# Patient Record
Sex: Male | Born: 1945
Health system: Southern US, Community
[De-identification: ages and names within clinical notes are randomized; demographics above are authoritative.]

---

## 2000-05-14 ENCOUNTER — Ambulatory Visit (HOSPITAL_BASED_OUTPATIENT_CLINIC_OR_DEPARTMENT_OTHER): Admission: RE | Admit: 2000-05-14 | Discharge: 2000-05-14 | Payer: Self-pay | Admitting: Urology

## 2002-10-05 ENCOUNTER — Encounter: Admission: RE | Admit: 2002-10-05 | Discharge: 2002-10-05 | Payer: Self-pay | Admitting: Internal Medicine

## 2013-10-23 DIAGNOSIS — S139XXA Sprain of joints and ligaments of unspecified parts of neck, initial encounter: Secondary | ICD-10-CM | POA: Diagnosis not present

## 2013-10-23 DIAGNOSIS — R350 Frequency of micturition: Secondary | ICD-10-CM | POA: Diagnosis not present

## 2013-10-26 DIAGNOSIS — M62838 Other muscle spasm: Secondary | ICD-10-CM | POA: Diagnosis not present

## 2013-10-26 DIAGNOSIS — M9981 Other biomechanical lesions of cervical region: Secondary | ICD-10-CM | POA: Diagnosis not present

## 2013-10-26 DIAGNOSIS — M5412 Radiculopathy, cervical region: Secondary | ICD-10-CM | POA: Diagnosis not present

## 2013-10-26 DIAGNOSIS — M5030 Other cervical disc degeneration, unspecified cervical region: Secondary | ICD-10-CM | POA: Diagnosis not present

## 2013-10-26 DIAGNOSIS — R51 Headache: Secondary | ICD-10-CM | POA: Diagnosis not present

## 2013-10-26 DIAGNOSIS — M999 Biomechanical lesion, unspecified: Secondary | ICD-10-CM | POA: Diagnosis not present

## 2013-10-27 DIAGNOSIS — M5412 Radiculopathy, cervical region: Secondary | ICD-10-CM | POA: Diagnosis not present

## 2013-10-27 DIAGNOSIS — M62838 Other muscle spasm: Secondary | ICD-10-CM | POA: Diagnosis not present

## 2013-10-27 DIAGNOSIS — R51 Headache: Secondary | ICD-10-CM | POA: Diagnosis not present

## 2013-10-27 DIAGNOSIS — M999 Biomechanical lesion, unspecified: Secondary | ICD-10-CM | POA: Diagnosis not present

## 2013-10-27 DIAGNOSIS — M9981 Other biomechanical lesions of cervical region: Secondary | ICD-10-CM | POA: Diagnosis not present

## 2013-10-27 DIAGNOSIS — M5030 Other cervical disc degeneration, unspecified cervical region: Secondary | ICD-10-CM | POA: Diagnosis not present

## 2013-10-31 DIAGNOSIS — M5412 Radiculopathy, cervical region: Secondary | ICD-10-CM | POA: Diagnosis not present

## 2013-10-31 DIAGNOSIS — M999 Biomechanical lesion, unspecified: Secondary | ICD-10-CM | POA: Diagnosis not present

## 2013-10-31 DIAGNOSIS — M62838 Other muscle spasm: Secondary | ICD-10-CM | POA: Diagnosis not present

## 2013-10-31 DIAGNOSIS — M9981 Other biomechanical lesions of cervical region: Secondary | ICD-10-CM | POA: Diagnosis not present

## 2013-10-31 DIAGNOSIS — R51 Headache: Secondary | ICD-10-CM | POA: Diagnosis not present

## 2013-10-31 DIAGNOSIS — M5030 Other cervical disc degeneration, unspecified cervical region: Secondary | ICD-10-CM | POA: Diagnosis not present

## 2013-11-01 DIAGNOSIS — R51 Headache: Secondary | ICD-10-CM | POA: Diagnosis not present

## 2013-11-01 DIAGNOSIS — M5030 Other cervical disc degeneration, unspecified cervical region: Secondary | ICD-10-CM | POA: Diagnosis not present

## 2013-11-01 DIAGNOSIS — M62838 Other muscle spasm: Secondary | ICD-10-CM | POA: Diagnosis not present

## 2013-11-01 DIAGNOSIS — M5412 Radiculopathy, cervical region: Secondary | ICD-10-CM | POA: Diagnosis not present

## 2013-11-01 DIAGNOSIS — M999 Biomechanical lesion, unspecified: Secondary | ICD-10-CM | POA: Diagnosis not present

## 2013-11-01 DIAGNOSIS — M9981 Other biomechanical lesions of cervical region: Secondary | ICD-10-CM | POA: Diagnosis not present

## 2013-11-03 DIAGNOSIS — M5412 Radiculopathy, cervical region: Secondary | ICD-10-CM | POA: Diagnosis not present

## 2013-11-03 DIAGNOSIS — R51 Headache: Secondary | ICD-10-CM | POA: Diagnosis not present

## 2013-11-03 DIAGNOSIS — M9981 Other biomechanical lesions of cervical region: Secondary | ICD-10-CM | POA: Diagnosis not present

## 2013-11-03 DIAGNOSIS — M5030 Other cervical disc degeneration, unspecified cervical region: Secondary | ICD-10-CM | POA: Diagnosis not present

## 2013-11-03 DIAGNOSIS — M999 Biomechanical lesion, unspecified: Secondary | ICD-10-CM | POA: Diagnosis not present

## 2013-11-03 DIAGNOSIS — M62838 Other muscle spasm: Secondary | ICD-10-CM | POA: Diagnosis not present

## 2013-11-06 DIAGNOSIS — M999 Biomechanical lesion, unspecified: Secondary | ICD-10-CM | POA: Diagnosis not present

## 2013-11-06 DIAGNOSIS — R51 Headache: Secondary | ICD-10-CM | POA: Diagnosis not present

## 2013-11-06 DIAGNOSIS — M9981 Other biomechanical lesions of cervical region: Secondary | ICD-10-CM | POA: Diagnosis not present

## 2013-11-06 DIAGNOSIS — M5030 Other cervical disc degeneration, unspecified cervical region: Secondary | ICD-10-CM | POA: Diagnosis not present

## 2013-11-06 DIAGNOSIS — M62838 Other muscle spasm: Secondary | ICD-10-CM | POA: Diagnosis not present

## 2013-11-06 DIAGNOSIS — M5412 Radiculopathy, cervical region: Secondary | ICD-10-CM | POA: Diagnosis not present

## 2013-11-13 DIAGNOSIS — R351 Nocturia: Secondary | ICD-10-CM | POA: Diagnosis not present

## 2013-11-13 DIAGNOSIS — I10 Essential (primary) hypertension: Secondary | ICD-10-CM | POA: Diagnosis not present

## 2013-11-13 DIAGNOSIS — E7800 Pure hypercholesterolemia, unspecified: Secondary | ICD-10-CM | POA: Diagnosis not present

## 2014-02-08 DIAGNOSIS — H43819 Vitreous degeneration, unspecified eye: Secondary | ICD-10-CM | POA: Diagnosis not present

## 2014-02-14 DIAGNOSIS — L919 Hypertrophic disorder of the skin, unspecified: Secondary | ICD-10-CM | POA: Diagnosis not present

## 2014-02-14 DIAGNOSIS — D235 Other benign neoplasm of skin of trunk: Secondary | ICD-10-CM | POA: Diagnosis not present

## 2014-02-14 DIAGNOSIS — L821 Other seborrheic keratosis: Secondary | ICD-10-CM | POA: Diagnosis not present

## 2014-02-14 DIAGNOSIS — L909 Atrophic disorder of skin, unspecified: Secondary | ICD-10-CM | POA: Diagnosis not present

## 2014-02-14 DIAGNOSIS — Z23 Encounter for immunization: Secondary | ICD-10-CM | POA: Diagnosis not present

## 2014-02-14 DIAGNOSIS — L819 Disorder of pigmentation, unspecified: Secondary | ICD-10-CM | POA: Diagnosis not present

## 2014-03-22 DIAGNOSIS — J3489 Other specified disorders of nose and nasal sinuses: Secondary | ICD-10-CM | POA: Diagnosis not present

## 2014-03-22 DIAGNOSIS — R0982 Postnasal drip: Secondary | ICD-10-CM | POA: Diagnosis not present

## 2014-03-22 DIAGNOSIS — R05 Cough: Secondary | ICD-10-CM | POA: Diagnosis not present

## 2014-03-22 DIAGNOSIS — R059 Cough, unspecified: Secondary | ICD-10-CM | POA: Diagnosis not present

## 2014-06-23 DIAGNOSIS — Z23 Encounter for immunization: Secondary | ICD-10-CM | POA: Diagnosis not present

## 2014-10-17 DIAGNOSIS — Z1389 Encounter for screening for other disorder: Secondary | ICD-10-CM | POA: Diagnosis not present

## 2014-10-17 DIAGNOSIS — R002 Palpitations: Secondary | ICD-10-CM | POA: Diagnosis not present

## 2015-03-14 DIAGNOSIS — R002 Palpitations: Secondary | ICD-10-CM | POA: Diagnosis not present

## 2015-03-14 DIAGNOSIS — R072 Precordial pain: Secondary | ICD-10-CM | POA: Diagnosis not present

## 2015-03-29 DIAGNOSIS — R072 Precordial pain: Secondary | ICD-10-CM | POA: Diagnosis not present

## 2015-03-29 DIAGNOSIS — R002 Palpitations: Secondary | ICD-10-CM | POA: Diagnosis not present

## 2015-04-10 DIAGNOSIS — Z79899 Other long term (current) drug therapy: Secondary | ICD-10-CM | POA: Diagnosis not present

## 2015-04-10 DIAGNOSIS — M47816 Spondylosis without myelopathy or radiculopathy, lumbar region: Secondary | ICD-10-CM | POA: Diagnosis not present

## 2015-04-10 DIAGNOSIS — S32010A Wedge compression fracture of first lumbar vertebra, initial encounter for closed fracture: Secondary | ICD-10-CM | POA: Diagnosis not present

## 2015-04-10 DIAGNOSIS — S32019A Unspecified fracture of first lumbar vertebra, initial encounter for closed fracture: Secondary | ICD-10-CM | POA: Diagnosis not present

## 2015-04-10 DIAGNOSIS — I10 Essential (primary) hypertension: Secondary | ICD-10-CM | POA: Diagnosis not present

## 2015-04-10 DIAGNOSIS — M1990 Unspecified osteoarthritis, unspecified site: Secondary | ICD-10-CM | POA: Diagnosis not present

## 2015-04-10 DIAGNOSIS — K759 Inflammatory liver disease, unspecified: Secondary | ICD-10-CM | POA: Diagnosis not present

## 2015-04-10 DIAGNOSIS — K259 Gastric ulcer, unspecified as acute or chronic, without hemorrhage or perforation: Secondary | ICD-10-CM | POA: Diagnosis not present

## 2015-04-10 DIAGNOSIS — M47896 Other spondylosis, lumbar region: Secondary | ICD-10-CM | POA: Diagnosis not present

## 2015-04-10 DIAGNOSIS — M4316 Spondylolisthesis, lumbar region: Secondary | ICD-10-CM | POA: Diagnosis not present

## 2015-04-12 DIAGNOSIS — S3992XA Unspecified injury of lower back, initial encounter: Secondary | ICD-10-CM | POA: Diagnosis not present

## 2015-04-12 DIAGNOSIS — S32010A Wedge compression fracture of first lumbar vertebra, initial encounter for closed fracture: Secondary | ICD-10-CM | POA: Diagnosis not present

## 2015-04-12 DIAGNOSIS — K5900 Constipation, unspecified: Secondary | ICD-10-CM | POA: Diagnosis not present

## 2015-04-15 ENCOUNTER — Other Ambulatory Visit (HOSPITAL_BASED_OUTPATIENT_CLINIC_OR_DEPARTMENT_OTHER): Payer: Self-pay | Admitting: Family Medicine

## 2015-04-15 DIAGNOSIS — IMO0002 Reserved for concepts with insufficient information to code with codable children: Secondary | ICD-10-CM

## 2015-04-20 ENCOUNTER — Ambulatory Visit (HOSPITAL_BASED_OUTPATIENT_CLINIC_OR_DEPARTMENT_OTHER)
Admission: RE | Admit: 2015-04-20 | Discharge: 2015-04-20 | Disposition: A | Discharge status: Home / Self Care | Payer: Medicare Other | Source: Ambulatory Visit | Attending: Family Medicine | Admitting: Family Medicine

## 2015-04-20 DIAGNOSIS — M1288 Other specific arthropathies, not elsewhere classified, other specified site: Secondary | ICD-10-CM | POA: Insufficient documentation

## 2015-04-20 DIAGNOSIS — M4856XA Collapsed vertebra, not elsewhere classified, lumbar region, initial encounter for fracture: Secondary | ICD-10-CM | POA: Insufficient documentation

## 2015-04-20 DIAGNOSIS — M5136 Other intervertebral disc degeneration, lumbar region: Secondary | ICD-10-CM | POA: Diagnosis not present

## 2015-04-20 DIAGNOSIS — M4806 Spinal stenosis, lumbar region: Secondary | ICD-10-CM | POA: Insufficient documentation

## 2015-04-20 DIAGNOSIS — IMO0002 Reserved for concepts with insufficient information to code with codable children: Secondary | ICD-10-CM

## 2015-04-20 DIAGNOSIS — M545 Low back pain: Secondary | ICD-10-CM | POA: Diagnosis present

## 2015-04-24 DIAGNOSIS — I340 Nonrheumatic mitral (valve) insufficiency: Secondary | ICD-10-CM | POA: Diagnosis not present

## 2015-04-24 DIAGNOSIS — I700 Atherosclerosis of aorta: Secondary | ICD-10-CM | POA: Diagnosis not present

## 2015-04-30 DIAGNOSIS — S32000A Wedge compression fracture of unspecified lumbar vertebra, initial encounter for closed fracture: Secondary | ICD-10-CM | POA: Diagnosis not present

## 2015-05-02 ENCOUNTER — Other Ambulatory Visit (HOSPITAL_BASED_OUTPATIENT_CLINIC_OR_DEPARTMENT_OTHER): Payer: Self-pay | Admitting: Family Medicine

## 2015-05-02 ENCOUNTER — Ambulatory Visit (HOSPITAL_BASED_OUTPATIENT_CLINIC_OR_DEPARTMENT_OTHER)
Admission: RE | Admit: 2015-05-02 | Discharge: 2015-05-02 | Disposition: A | Discharge status: Home / Self Care | Payer: Medicare Other | Source: Ambulatory Visit | Attending: Family Medicine | Admitting: Family Medicine

## 2015-05-02 DIAGNOSIS — N2889 Other specified disorders of kidney and ureter: Secondary | ICD-10-CM

## 2015-05-02 DIAGNOSIS — N281 Cyst of kidney, acquired: Secondary | ICD-10-CM

## 2015-05-02 DIAGNOSIS — N289 Disorder of kidney and ureter, unspecified: Secondary | ICD-10-CM

## 2015-05-02 DIAGNOSIS — M545 Low back pain: Secondary | ICD-10-CM

## 2015-05-28 DIAGNOSIS — S32010D Wedge compression fracture of first lumbar vertebra, subsequent encounter for fracture with routine healing: Secondary | ICD-10-CM | POA: Diagnosis not present

## 2015-06-13 DIAGNOSIS — Z23 Encounter for immunization: Secondary | ICD-10-CM | POA: Diagnosis not present

## 2015-06-18 DIAGNOSIS — S32010D Wedge compression fracture of first lumbar vertebra, subsequent encounter for fracture with routine healing: Secondary | ICD-10-CM | POA: Diagnosis not present

## 2015-07-16 DIAGNOSIS — S32010D Wedge compression fracture of first lumbar vertebra, subsequent encounter for fracture with routine healing: Secondary | ICD-10-CM | POA: Diagnosis not present

## 2015-10-08 DIAGNOSIS — S32010D Wedge compression fracture of first lumbar vertebra, subsequent encounter for fracture with routine healing: Secondary | ICD-10-CM | POA: Diagnosis not present

## 2016-01-03 DIAGNOSIS — M25522 Pain in left elbow: Secondary | ICD-10-CM | POA: Diagnosis not present

## 2016-05-27 DIAGNOSIS — Z23 Encounter for immunization: Secondary | ICD-10-CM | POA: Diagnosis not present

## 2016-07-27 DIAGNOSIS — I10 Essential (primary) hypertension: Secondary | ICD-10-CM | POA: Diagnosis not present

## 2016-07-27 DIAGNOSIS — Z0000 Encounter for general adult medical examination without abnormal findings: Secondary | ICD-10-CM | POA: Diagnosis not present

## 2016-07-27 DIAGNOSIS — Z1211 Encounter for screening for malignant neoplasm of colon: Secondary | ICD-10-CM | POA: Diagnosis not present

## 2016-07-27 DIAGNOSIS — Z125 Encounter for screening for malignant neoplasm of prostate: Secondary | ICD-10-CM | POA: Diagnosis not present

## 2017-02-23 DIAGNOSIS — R109 Unspecified abdominal pain: Secondary | ICD-10-CM | POA: Diagnosis not present

## 2017-02-24 DIAGNOSIS — R109 Unspecified abdominal pain: Secondary | ICD-10-CM | POA: Diagnosis not present

## 2017-06-24 IMAGING — US US RENAL
1 series · 14 of 23 positions shown · non-contrast
Comparison: 04/20/2015

CLINICAL DATA: Followup cyst

EXAM:
RENAL / URINARY TRACT ULTRASOUND COMPLETE

[Series 1: us renal · 0.24mm/px · 14 of 23 slices shown]
[im 1/23]
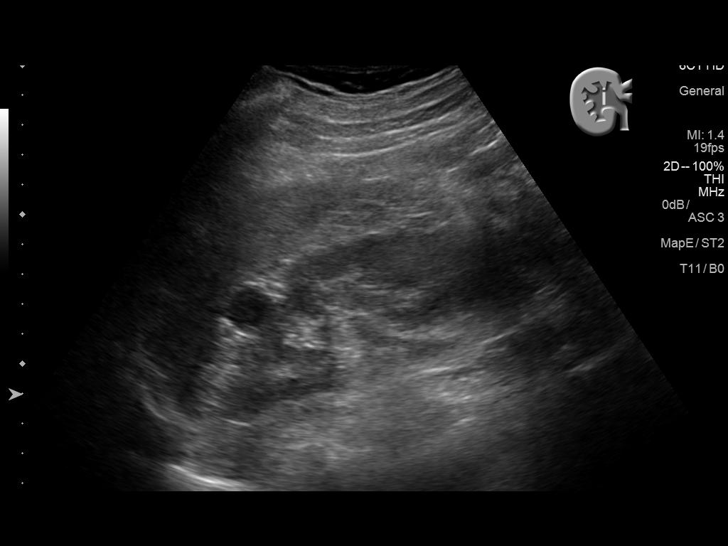
[im 3/23]
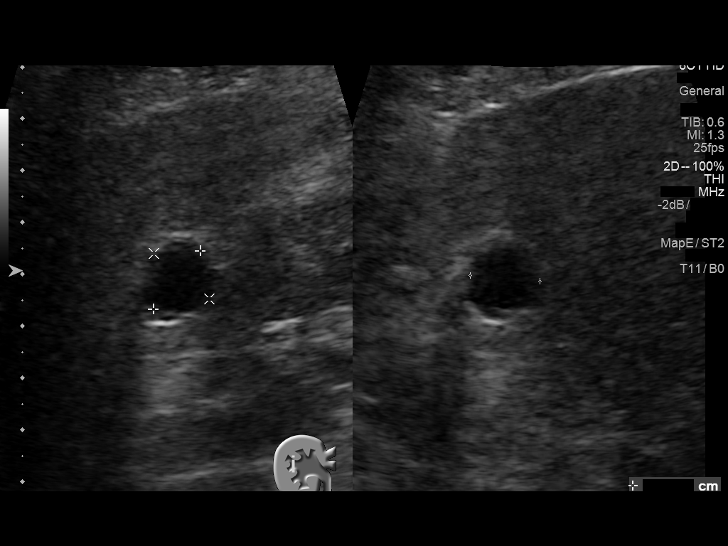
[im 5/23]
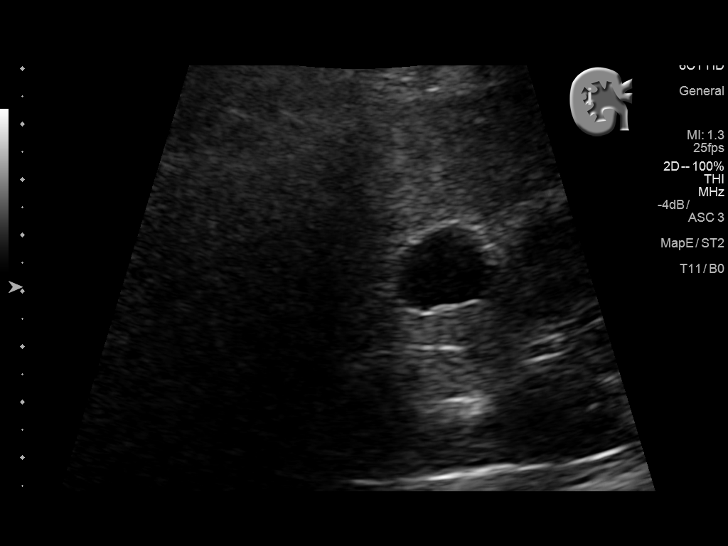
[im 6/23]
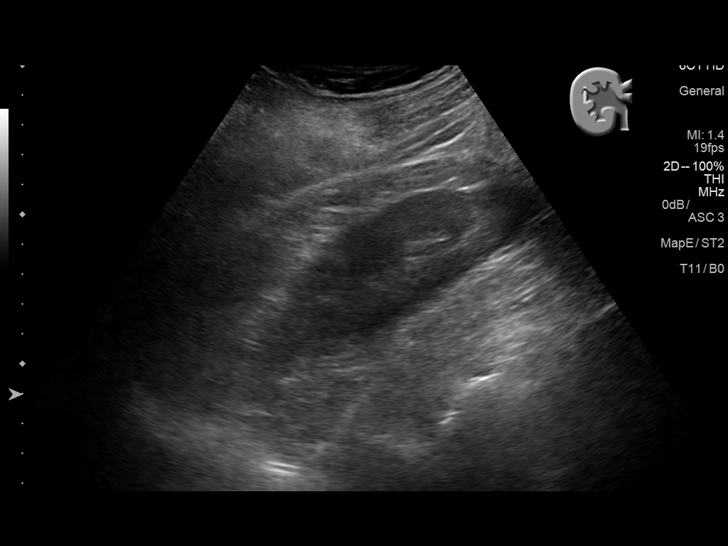
[im 8/23]
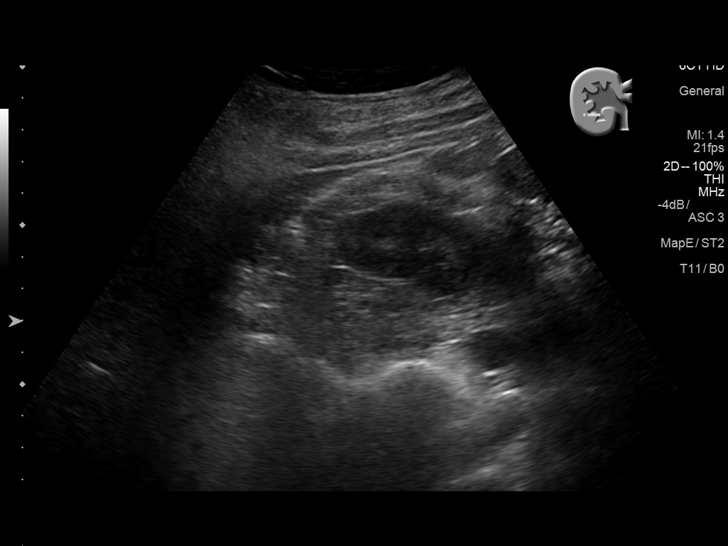
[im 10/23]
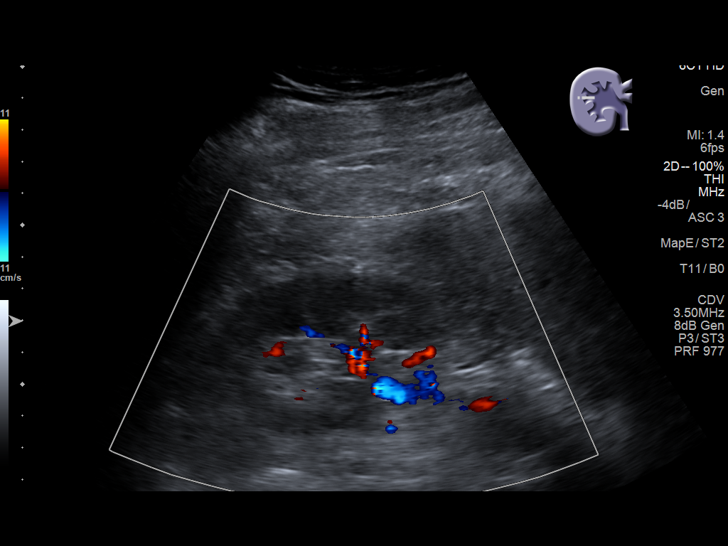
[im 11/23]
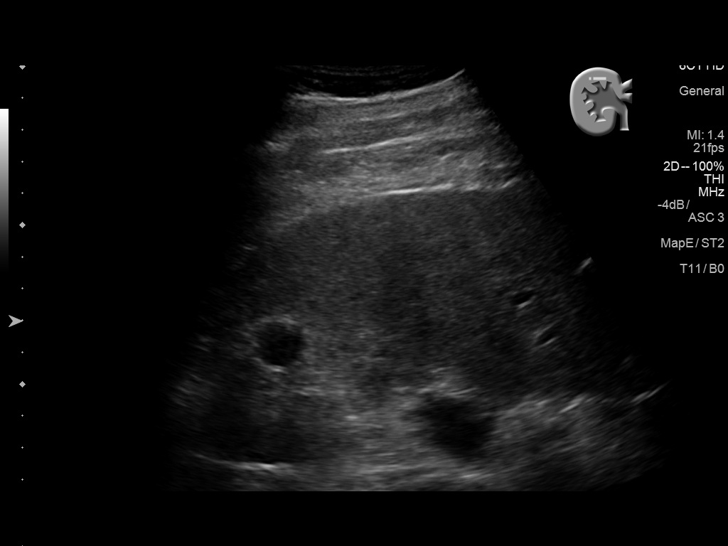
[im 13/23]
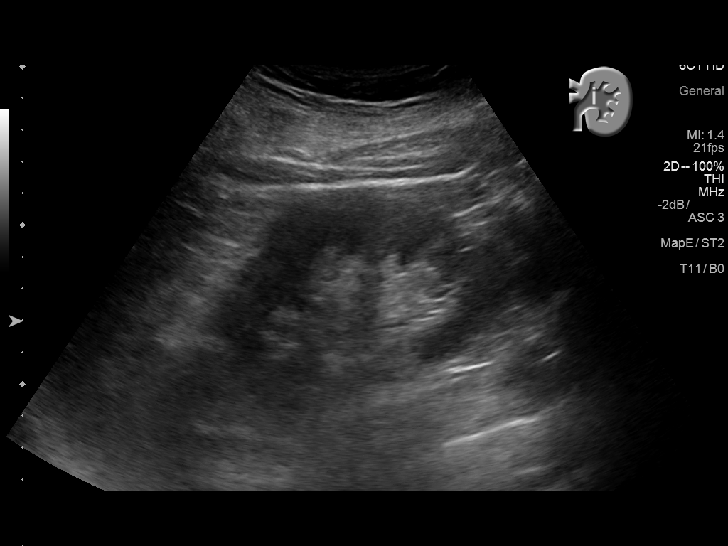
[im 14/23]
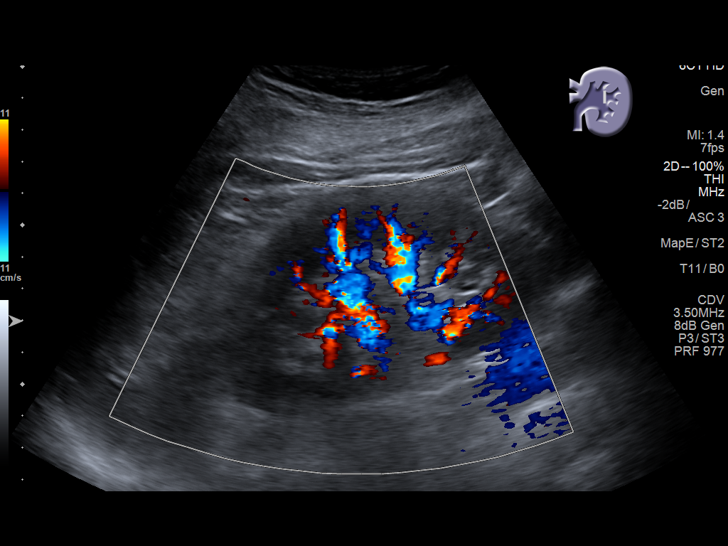
[im 16/23]
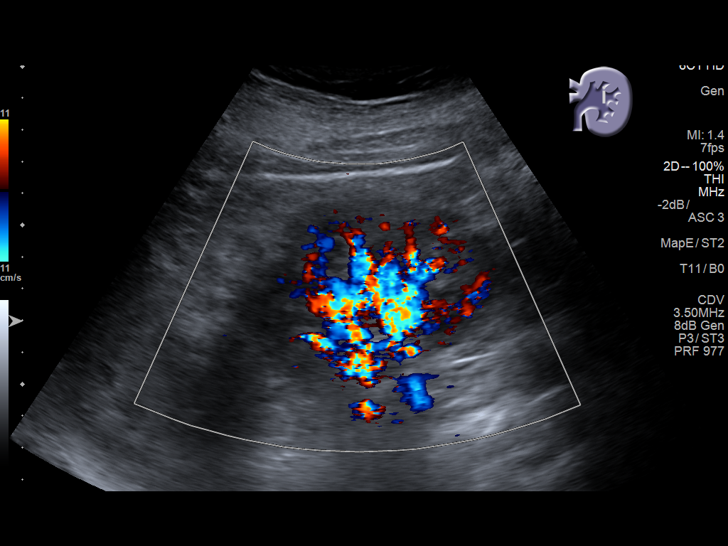
[im 18/23]
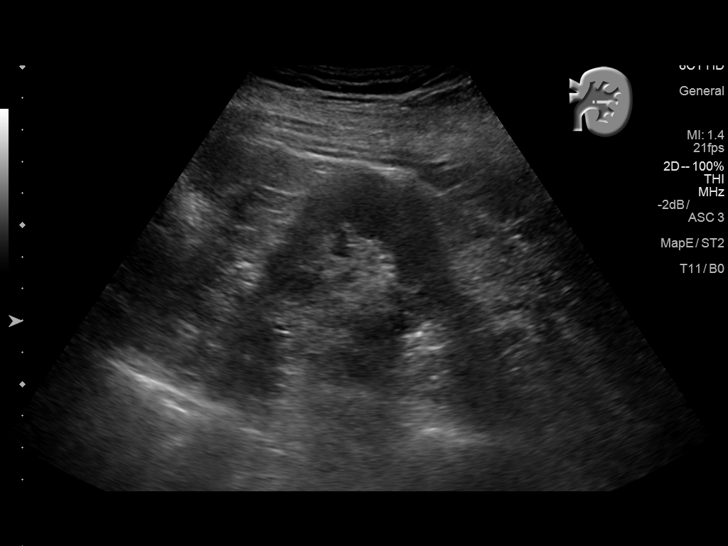
[im 19/23]
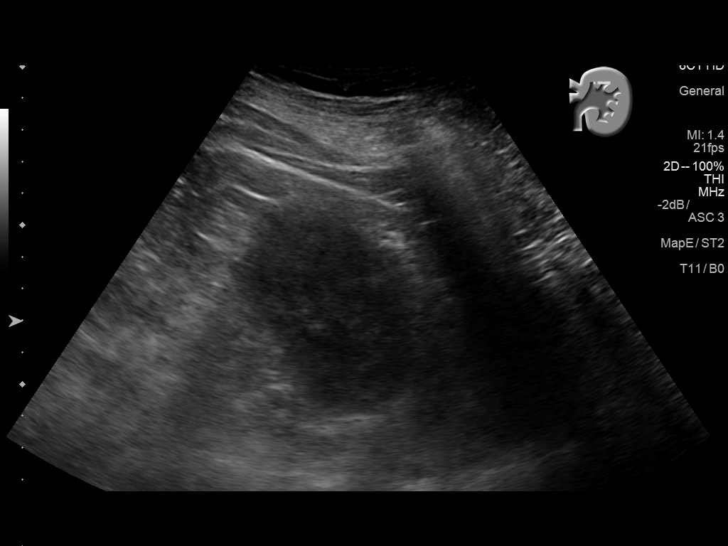
[im 21/23]
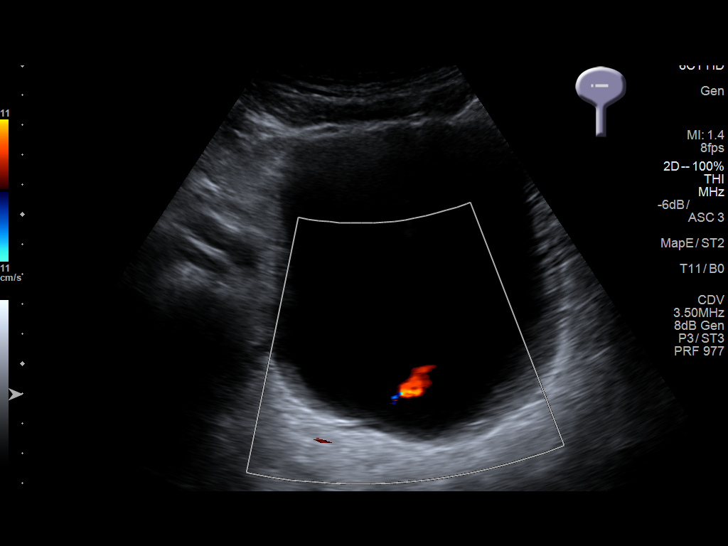
[im 23/23]
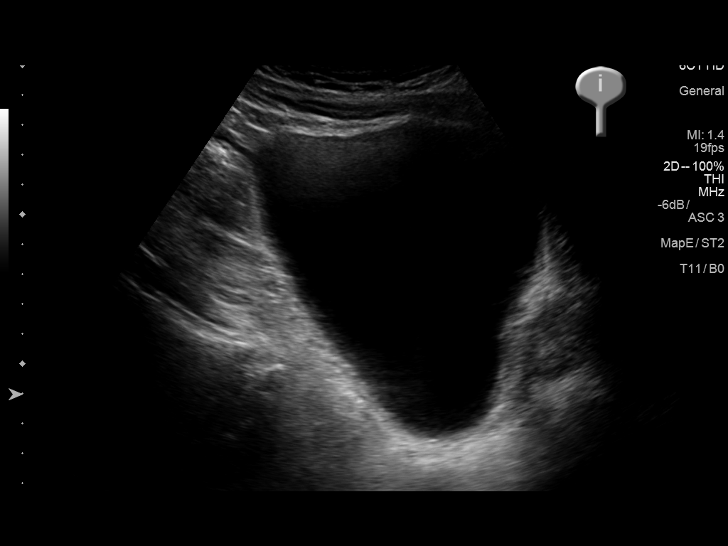

[14 of 23 positions shown; findings below may reference images not displayed]

FINDINGS: Right Kidney:

Length: 12.1 cm. Echogenicity within normal limits. No mass or
hydronephrosis visualized. Simple cyst in the upper pole.

Left Kidney:

Length: 11.7 cm. Echogenicity within normal limits. No mass or
hydronephrosis visualized.

Bladder:

Appears normal for degree of bladder distention.
IMPRESSION: No acute pathology.  Simple cyst in the right kidney.

## 2017-07-01 DIAGNOSIS — Z1211 Encounter for screening for malignant neoplasm of colon: Secondary | ICD-10-CM | POA: Diagnosis not present

## 2017-07-01 DIAGNOSIS — D126 Benign neoplasm of colon, unspecified: Secondary | ICD-10-CM | POA: Diagnosis not present

## 2017-07-02 DIAGNOSIS — Z23 Encounter for immunization: Secondary | ICD-10-CM | POA: Diagnosis not present

## 2017-07-06 DIAGNOSIS — D126 Benign neoplasm of colon, unspecified: Secondary | ICD-10-CM | POA: Diagnosis not present

## 2017-07-06 DIAGNOSIS — Z1211 Encounter for screening for malignant neoplasm of colon: Secondary | ICD-10-CM | POA: Diagnosis not present

## 2017-07-30 DIAGNOSIS — Z0000 Encounter for general adult medical examination without abnormal findings: Secondary | ICD-10-CM | POA: Diagnosis not present

## 2017-07-30 DIAGNOSIS — Z23 Encounter for immunization: Secondary | ICD-10-CM | POA: Diagnosis not present

## 2017-07-30 DIAGNOSIS — N400 Enlarged prostate without lower urinary tract symptoms: Secondary | ICD-10-CM | POA: Diagnosis not present

## 2017-07-30 DIAGNOSIS — I10 Essential (primary) hypertension: Secondary | ICD-10-CM | POA: Diagnosis not present

## 2017-09-13 DIAGNOSIS — G4700 Insomnia, unspecified: Secondary | ICD-10-CM | POA: Diagnosis not present

## 2017-10-14 DIAGNOSIS — G4700 Insomnia, unspecified: Secondary | ICD-10-CM | POA: Diagnosis not present

## 2017-10-19 DIAGNOSIS — N529 Male erectile dysfunction, unspecified: Secondary | ICD-10-CM | POA: Diagnosis not present

## 2017-10-19 DIAGNOSIS — Z803 Family history of malignant neoplasm of breast: Secondary | ICD-10-CM | POA: Diagnosis not present

## 2017-10-19 DIAGNOSIS — G4700 Insomnia, unspecified: Secondary | ICD-10-CM | POA: Diagnosis not present

## 2017-10-19 DIAGNOSIS — K08409 Partial loss of teeth, unspecified cause, unspecified class: Secondary | ICD-10-CM | POA: Diagnosis not present

## 2017-10-19 DIAGNOSIS — Z888 Allergy status to other drugs, medicaments and biological substances status: Secondary | ICD-10-CM | POA: Diagnosis not present

## 2017-10-19 DIAGNOSIS — I10 Essential (primary) hypertension: Secondary | ICD-10-CM | POA: Diagnosis not present

## 2017-10-22 DIAGNOSIS — J069 Acute upper respiratory infection, unspecified: Secondary | ICD-10-CM | POA: Diagnosis not present

## 2018-01-11 DIAGNOSIS — G4700 Insomnia, unspecified: Secondary | ICD-10-CM | POA: Diagnosis not present

## 2018-04-13 DIAGNOSIS — M25561 Pain in right knee: Secondary | ICD-10-CM | POA: Diagnosis not present

## 2018-04-13 DIAGNOSIS — G4700 Insomnia, unspecified: Secondary | ICD-10-CM | POA: Diagnosis not present

## 2018-04-13 DIAGNOSIS — G8929 Other chronic pain: Secondary | ICD-10-CM | POA: Diagnosis not present

## 2018-04-13 DIAGNOSIS — M25562 Pain in left knee: Secondary | ICD-10-CM | POA: Diagnosis not present

## 2018-04-29 DIAGNOSIS — M170 Bilateral primary osteoarthritis of knee: Secondary | ICD-10-CM | POA: Diagnosis not present

## 2018-05-20 DIAGNOSIS — R69 Illness, unspecified: Secondary | ICD-10-CM | POA: Diagnosis not present

## 2018-05-26 DIAGNOSIS — D225 Melanocytic nevi of trunk: Secondary | ICD-10-CM | POA: Diagnosis not present

## 2018-05-26 DIAGNOSIS — L821 Other seborrheic keratosis: Secondary | ICD-10-CM | POA: Diagnosis not present

## 2018-05-26 DIAGNOSIS — B360 Pityriasis versicolor: Secondary | ICD-10-CM | POA: Diagnosis not present

## 2018-06-13 DIAGNOSIS — R69 Illness, unspecified: Secondary | ICD-10-CM | POA: Diagnosis not present

## 2018-06-20 DIAGNOSIS — R69 Illness, unspecified: Secondary | ICD-10-CM | POA: Diagnosis not present

## 2018-06-23 DIAGNOSIS — M25562 Pain in left knee: Secondary | ICD-10-CM | POA: Diagnosis not present

## 2018-06-23 DIAGNOSIS — M170 Bilateral primary osteoarthritis of knee: Secondary | ICD-10-CM | POA: Diagnosis not present

## 2018-06-23 DIAGNOSIS — M25561 Pain in right knee: Secondary | ICD-10-CM | POA: Diagnosis not present

## 2018-08-25 DIAGNOSIS — R0789 Other chest pain: Secondary | ICD-10-CM | POA: Diagnosis not present

## 2018-08-25 DIAGNOSIS — K759 Inflammatory liver disease, unspecified: Secondary | ICD-10-CM | POA: Diagnosis not present

## 2018-08-25 DIAGNOSIS — R079 Chest pain, unspecified: Secondary | ICD-10-CM | POA: Diagnosis not present

## 2018-08-25 DIAGNOSIS — R9431 Abnormal electrocardiogram [ECG] [EKG]: Secondary | ICD-10-CM | POA: Diagnosis not present

## 2018-08-25 DIAGNOSIS — R001 Bradycardia, unspecified: Secondary | ICD-10-CM | POA: Diagnosis not present

## 2018-08-25 DIAGNOSIS — M1991 Primary osteoarthritis, unspecified site: Secondary | ICD-10-CM | POA: Diagnosis not present

## 2018-08-25 DIAGNOSIS — R072 Precordial pain: Secondary | ICD-10-CM | POA: Diagnosis not present

## 2018-08-25 DIAGNOSIS — R42 Dizziness and giddiness: Secondary | ICD-10-CM | POA: Diagnosis not present

## 2018-08-25 DIAGNOSIS — I10 Essential (primary) hypertension: Secondary | ICD-10-CM | POA: Diagnosis not present

## 2018-08-25 DIAGNOSIS — Z79899 Other long term (current) drug therapy: Secondary | ICD-10-CM | POA: Diagnosis not present

## 2018-09-09 DIAGNOSIS — Z0000 Encounter for general adult medical examination without abnormal findings: Secondary | ICD-10-CM | POA: Diagnosis not present

## 2018-09-09 DIAGNOSIS — R351 Nocturia: Secondary | ICD-10-CM | POA: Diagnosis not present

## 2018-09-09 DIAGNOSIS — Z125 Encounter for screening for malignant neoplasm of prostate: Secondary | ICD-10-CM | POA: Diagnosis not present

## 2018-09-09 DIAGNOSIS — I10 Essential (primary) hypertension: Secondary | ICD-10-CM | POA: Diagnosis not present

## 2018-09-09 DIAGNOSIS — R12 Heartburn: Secondary | ICD-10-CM | POA: Diagnosis not present

## 2018-09-09 DIAGNOSIS — Z1159 Encounter for screening for other viral diseases: Secondary | ICD-10-CM | POA: Diagnosis not present

## 2018-09-09 DIAGNOSIS — G4700 Insomnia, unspecified: Secondary | ICD-10-CM | POA: Diagnosis not present

## 2018-09-13 DIAGNOSIS — R69 Illness, unspecified: Secondary | ICD-10-CM | POA: Diagnosis not present

## 2018-09-26 DIAGNOSIS — M25561 Pain in right knee: Secondary | ICD-10-CM | POA: Diagnosis not present

## 2018-09-26 DIAGNOSIS — M170 Bilateral primary osteoarthritis of knee: Secondary | ICD-10-CM | POA: Diagnosis not present

## 2018-09-26 DIAGNOSIS — M25562 Pain in left knee: Secondary | ICD-10-CM | POA: Diagnosis not present

## 2018-10-10 DIAGNOSIS — M25561 Pain in right knee: Secondary | ICD-10-CM | POA: Diagnosis not present

## 2018-10-10 DIAGNOSIS — M1711 Unilateral primary osteoarthritis, right knee: Secondary | ICD-10-CM | POA: Diagnosis not present

## 2018-10-11 DIAGNOSIS — H269 Unspecified cataract: Secondary | ICD-10-CM | POA: Diagnosis not present

## 2018-10-11 DIAGNOSIS — H524 Presbyopia: Secondary | ICD-10-CM | POA: Diagnosis not present

## 2018-10-11 DIAGNOSIS — H5203 Hypermetropia, bilateral: Secondary | ICD-10-CM | POA: Diagnosis not present

## 2018-10-11 DIAGNOSIS — H52209 Unspecified astigmatism, unspecified eye: Secondary | ICD-10-CM | POA: Diagnosis not present

## 2018-10-17 DIAGNOSIS — M1712 Unilateral primary osteoarthritis, left knee: Secondary | ICD-10-CM | POA: Diagnosis not present

## 2018-10-17 DIAGNOSIS — M25562 Pain in left knee: Secondary | ICD-10-CM | POA: Diagnosis not present

## 2018-10-20 DIAGNOSIS — H40003 Preglaucoma, unspecified, bilateral: Secondary | ICD-10-CM | POA: Diagnosis not present

## 2018-10-20 DIAGNOSIS — H25813 Combined forms of age-related cataract, bilateral: Secondary | ICD-10-CM | POA: Diagnosis not present

## 2018-10-24 DIAGNOSIS — M1711 Unilateral primary osteoarthritis, right knee: Secondary | ICD-10-CM | POA: Diagnosis not present

## 2018-10-24 DIAGNOSIS — M25561 Pain in right knee: Secondary | ICD-10-CM | POA: Diagnosis not present

## 2018-10-31 DIAGNOSIS — M1712 Unilateral primary osteoarthritis, left knee: Secondary | ICD-10-CM | POA: Diagnosis not present

## 2018-10-31 DIAGNOSIS — M25562 Pain in left knee: Secondary | ICD-10-CM | POA: Diagnosis not present

## 2018-11-09 DIAGNOSIS — M1711 Unilateral primary osteoarthritis, right knee: Secondary | ICD-10-CM | POA: Diagnosis not present

## 2018-11-09 DIAGNOSIS — M25561 Pain in right knee: Secondary | ICD-10-CM | POA: Diagnosis not present

## 2018-11-10 DIAGNOSIS — M25562 Pain in left knee: Secondary | ICD-10-CM | POA: Diagnosis not present

## 2018-11-10 DIAGNOSIS — M1712 Unilateral primary osteoarthritis, left knee: Secondary | ICD-10-CM | POA: Diagnosis not present

## 2018-12-07 DIAGNOSIS — M25562 Pain in left knee: Secondary | ICD-10-CM | POA: Diagnosis not present

## 2018-12-07 DIAGNOSIS — M170 Bilateral primary osteoarthritis of knee: Secondary | ICD-10-CM | POA: Diagnosis not present

## 2018-12-07 DIAGNOSIS — M25561 Pain in right knee: Secondary | ICD-10-CM | POA: Diagnosis not present

## 2019-04-26 DIAGNOSIS — R69 Illness, unspecified: Secondary | ICD-10-CM | POA: Diagnosis not present

## 2019-06-14 DIAGNOSIS — Z803 Family history of malignant neoplasm of breast: Secondary | ICD-10-CM | POA: Diagnosis not present

## 2019-06-14 DIAGNOSIS — I10 Essential (primary) hypertension: Secondary | ICD-10-CM | POA: Diagnosis not present

## 2019-06-14 DIAGNOSIS — G4700 Insomnia, unspecified: Secondary | ICD-10-CM | POA: Diagnosis not present

## 2019-06-14 DIAGNOSIS — R69 Illness, unspecified: Secondary | ICD-10-CM | POA: Diagnosis not present

## 2019-08-31 DIAGNOSIS — M2550 Pain in unspecified joint: Secondary | ICD-10-CM | POA: Diagnosis not present

## 2019-08-31 DIAGNOSIS — I10 Essential (primary) hypertension: Secondary | ICD-10-CM | POA: Diagnosis not present

## 2019-08-31 DIAGNOSIS — G4700 Insomnia, unspecified: Secondary | ICD-10-CM | POA: Diagnosis not present

## 2019-08-31 DIAGNOSIS — Z125 Encounter for screening for malignant neoplasm of prostate: Secondary | ICD-10-CM | POA: Diagnosis not present

## 2019-08-31 DIAGNOSIS — Z0000 Encounter for general adult medical examination without abnormal findings: Secondary | ICD-10-CM | POA: Diagnosis not present

## 2019-08-31 DIAGNOSIS — R002 Palpitations: Secondary | ICD-10-CM | POA: Diagnosis not present

## 2019-08-31 DIAGNOSIS — N529 Male erectile dysfunction, unspecified: Secondary | ICD-10-CM | POA: Diagnosis not present

## 2019-10-02 DIAGNOSIS — R69 Illness, unspecified: Secondary | ICD-10-CM | POA: Diagnosis not present

## 2019-10-11 DIAGNOSIS — R002 Palpitations: Secondary | ICD-10-CM | POA: Diagnosis not present

## 2019-10-11 DIAGNOSIS — R001 Bradycardia, unspecified: Secondary | ICD-10-CM | POA: Diagnosis not present

## 2019-10-11 DIAGNOSIS — R079 Chest pain, unspecified: Secondary | ICD-10-CM | POA: Diagnosis not present

## 2019-10-12 DIAGNOSIS — R079 Chest pain, unspecified: Secondary | ICD-10-CM | POA: Diagnosis not present

## 2019-10-17 DIAGNOSIS — R079 Chest pain, unspecified: Secondary | ICD-10-CM | POA: Diagnosis not present

## 2019-12-21 DIAGNOSIS — G8929 Other chronic pain: Secondary | ICD-10-CM | POA: Diagnosis not present

## 2019-12-21 DIAGNOSIS — N529 Male erectile dysfunction, unspecified: Secondary | ICD-10-CM | POA: Diagnosis not present

## 2019-12-21 DIAGNOSIS — R32 Unspecified urinary incontinence: Secondary | ICD-10-CM | POA: Diagnosis not present

## 2019-12-21 DIAGNOSIS — G4700 Insomnia, unspecified: Secondary | ICD-10-CM | POA: Diagnosis not present

## 2019-12-21 DIAGNOSIS — Z803 Family history of malignant neoplasm of breast: Secondary | ICD-10-CM | POA: Diagnosis not present

## 2019-12-21 DIAGNOSIS — I499 Cardiac arrhythmia, unspecified: Secondary | ICD-10-CM | POA: Diagnosis not present

## 2019-12-21 DIAGNOSIS — Z008 Encounter for other general examination: Secondary | ICD-10-CM | POA: Diagnosis not present

## 2019-12-21 DIAGNOSIS — I10 Essential (primary) hypertension: Secondary | ICD-10-CM | POA: Diagnosis not present

## 2019-12-21 DIAGNOSIS — Z9181 History of falling: Secondary | ICD-10-CM | POA: Diagnosis not present

## 2020-01-01 DIAGNOSIS — R918 Other nonspecific abnormal finding of lung field: Secondary | ICD-10-CM | POA: Diagnosis not present

## 2020-01-01 DIAGNOSIS — R001 Bradycardia, unspecified: Secondary | ICD-10-CM | POA: Diagnosis not present

## 2020-01-01 DIAGNOSIS — J90 Pleural effusion, not elsewhere classified: Secondary | ICD-10-CM | POA: Diagnosis not present

## 2020-01-01 DIAGNOSIS — R0781 Pleurodynia: Secondary | ICD-10-CM | POA: Diagnosis not present

## 2020-01-01 DIAGNOSIS — I10 Essential (primary) hypertension: Secondary | ICD-10-CM | POA: Diagnosis not present

## 2020-01-01 DIAGNOSIS — I2693 Single subsegmental pulmonary embolism without acute cor pulmonale: Secondary | ICD-10-CM | POA: Diagnosis not present

## 2020-01-01 DIAGNOSIS — R0789 Other chest pain: Secondary | ICD-10-CM | POA: Diagnosis not present

## 2020-01-01 DIAGNOSIS — Z79899 Other long term (current) drug therapy: Secondary | ICD-10-CM | POA: Diagnosis not present

## 2020-01-01 DIAGNOSIS — R7989 Other specified abnormal findings of blood chemistry: Secondary | ICD-10-CM | POA: Diagnosis not present

## 2020-01-01 DIAGNOSIS — I2699 Other pulmonary embolism without acute cor pulmonale: Secondary | ICD-10-CM | POA: Diagnosis not present

## 2020-01-01 DIAGNOSIS — R079 Chest pain, unspecified: Secondary | ICD-10-CM | POA: Diagnosis not present

## 2020-01-17 DIAGNOSIS — I2699 Other pulmonary embolism without acute cor pulmonale: Secondary | ICD-10-CM | POA: Diagnosis not present

## 2020-03-04 DIAGNOSIS — R079 Chest pain, unspecified: Secondary | ICD-10-CM | POA: Diagnosis not present

## 2020-03-05 DIAGNOSIS — R9439 Abnormal result of other cardiovascular function study: Secondary | ICD-10-CM | POA: Diagnosis not present

## 2020-03-05 DIAGNOSIS — Z86711 Personal history of pulmonary embolism: Secondary | ICD-10-CM | POA: Diagnosis not present

## 2020-03-20 DIAGNOSIS — Z86711 Personal history of pulmonary embolism: Secondary | ICD-10-CM | POA: Diagnosis not present

## 2020-03-20 DIAGNOSIS — R9439 Abnormal result of other cardiovascular function study: Secondary | ICD-10-CM | POA: Diagnosis not present

## 2020-03-28 DIAGNOSIS — Z20822 Contact with and (suspected) exposure to covid-19: Secondary | ICD-10-CM | POA: Diagnosis not present

## 2020-04-02 DIAGNOSIS — R69 Illness, unspecified: Secondary | ICD-10-CM | POA: Diagnosis not present

## 2020-04-12 DIAGNOSIS — Z86711 Personal history of pulmonary embolism: Secondary | ICD-10-CM | POA: Diagnosis not present

## 2020-04-12 DIAGNOSIS — N529 Male erectile dysfunction, unspecified: Secondary | ICD-10-CM | POA: Diagnosis not present

## 2020-04-26 DIAGNOSIS — Z86711 Personal history of pulmonary embolism: Secondary | ICD-10-CM | POA: Diagnosis not present

## 2020-06-10 DIAGNOSIS — L821 Other seborrheic keratosis: Secondary | ICD-10-CM | POA: Diagnosis not present

## 2020-06-10 DIAGNOSIS — B360 Pityriasis versicolor: Secondary | ICD-10-CM | POA: Diagnosis not present

## 2020-06-13 DIAGNOSIS — R69 Illness, unspecified: Secondary | ICD-10-CM | POA: Diagnosis not present

## 2020-07-23 DIAGNOSIS — H524 Presbyopia: Secondary | ICD-10-CM | POA: Diagnosis not present

## 2020-07-23 DIAGNOSIS — H269 Unspecified cataract: Secondary | ICD-10-CM | POA: Diagnosis not present

## 2020-08-02 DIAGNOSIS — B029 Zoster without complications: Secondary | ICD-10-CM | POA: Diagnosis not present

## 2020-08-08 DIAGNOSIS — R0789 Other chest pain: Secondary | ICD-10-CM | POA: Diagnosis not present

## 2020-08-08 DIAGNOSIS — Z79899 Other long term (current) drug therapy: Secondary | ICD-10-CM | POA: Diagnosis not present

## 2020-08-08 DIAGNOSIS — R079 Chest pain, unspecified: Secondary | ICD-10-CM | POA: Diagnosis not present

## 2020-08-08 DIAGNOSIS — R918 Other nonspecific abnormal finding of lung field: Secondary | ICD-10-CM | POA: Diagnosis not present

## 2020-08-08 DIAGNOSIS — R110 Nausea: Secondary | ICD-10-CM | POA: Diagnosis not present

## 2020-08-08 DIAGNOSIS — R001 Bradycardia, unspecified: Secondary | ICD-10-CM | POA: Diagnosis not present

## 2020-08-08 DIAGNOSIS — I10 Essential (primary) hypertension: Secondary | ICD-10-CM | POA: Diagnosis not present

## 2020-08-20 DIAGNOSIS — U071 COVID-19: Secondary | ICD-10-CM | POA: Diagnosis not present

## 2020-08-28 DIAGNOSIS — Z86711 Personal history of pulmonary embolism: Secondary | ICD-10-CM | POA: Diagnosis not present

## 2020-08-28 DIAGNOSIS — R079 Chest pain, unspecified: Secondary | ICD-10-CM | POA: Diagnosis not present

## 2020-08-28 DIAGNOSIS — R9439 Abnormal result of other cardiovascular function study: Secondary | ICD-10-CM | POA: Diagnosis not present

## 2020-08-29 DIAGNOSIS — Z1152 Encounter for screening for COVID-19: Secondary | ICD-10-CM | POA: Diagnosis not present

## 2020-08-30 DIAGNOSIS — M25561 Pain in right knee: Secondary | ICD-10-CM | POA: Diagnosis not present

## 2020-08-30 DIAGNOSIS — M25562 Pain in left knee: Secondary | ICD-10-CM | POA: Diagnosis not present

## 2020-09-05 DIAGNOSIS — I10 Essential (primary) hypertension: Secondary | ICD-10-CM | POA: Diagnosis not present

## 2020-09-05 DIAGNOSIS — Z86711 Personal history of pulmonary embolism: Secondary | ICD-10-CM | POA: Diagnosis not present

## 2020-09-05 DIAGNOSIS — N529 Male erectile dysfunction, unspecified: Secondary | ICD-10-CM | POA: Diagnosis not present

## 2020-09-05 DIAGNOSIS — G4700 Insomnia, unspecified: Secondary | ICD-10-CM | POA: Diagnosis not present

## 2020-09-05 DIAGNOSIS — Z0000 Encounter for general adult medical examination without abnormal findings: Secondary | ICD-10-CM | POA: Diagnosis not present

## 2020-09-05 DIAGNOSIS — Z7901 Long term (current) use of anticoagulants: Secondary | ICD-10-CM | POA: Diagnosis not present

## 2020-09-05 DIAGNOSIS — Z136 Encounter for screening for cardiovascular disorders: Secondary | ICD-10-CM | POA: Diagnosis not present

## 2020-09-05 DIAGNOSIS — Z125 Encounter for screening for malignant neoplasm of prostate: Secondary | ICD-10-CM | POA: Diagnosis not present

## 2020-09-27 DIAGNOSIS — M25561 Pain in right knee: Secondary | ICD-10-CM | POA: Diagnosis not present

## 2020-09-27 DIAGNOSIS — M25562 Pain in left knee: Secondary | ICD-10-CM | POA: Diagnosis not present

## 2020-10-03 DIAGNOSIS — M25562 Pain in left knee: Secondary | ICD-10-CM | POA: Diagnosis not present

## 2020-10-03 DIAGNOSIS — S8991XA Unspecified injury of right lower leg, initial encounter: Secondary | ICD-10-CM | POA: Diagnosis not present

## 2020-10-03 DIAGNOSIS — M25561 Pain in right knee: Secondary | ICD-10-CM | POA: Diagnosis not present

## 2020-10-11 DIAGNOSIS — M25561 Pain in right knee: Secondary | ICD-10-CM | POA: Diagnosis not present

## 2020-11-05 DIAGNOSIS — Z803 Family history of malignant neoplasm of breast: Secondary | ICD-10-CM | POA: Diagnosis not present

## 2020-11-05 DIAGNOSIS — G4700 Insomnia, unspecified: Secondary | ICD-10-CM | POA: Diagnosis not present

## 2020-11-05 DIAGNOSIS — Z7901 Long term (current) use of anticoagulants: Secondary | ICD-10-CM | POA: Diagnosis not present

## 2020-11-05 DIAGNOSIS — I499 Cardiac arrhythmia, unspecified: Secondary | ICD-10-CM | POA: Diagnosis not present

## 2020-11-05 DIAGNOSIS — N529 Male erectile dysfunction, unspecified: Secondary | ICD-10-CM | POA: Diagnosis not present

## 2020-11-05 DIAGNOSIS — R32 Unspecified urinary incontinence: Secondary | ICD-10-CM | POA: Diagnosis not present

## 2020-11-05 DIAGNOSIS — Z86711 Personal history of pulmonary embolism: Secondary | ICD-10-CM | POA: Diagnosis not present

## 2020-11-11 DIAGNOSIS — Z20822 Contact with and (suspected) exposure to covid-19: Secondary | ICD-10-CM | POA: Diagnosis not present

## 2020-11-11 DIAGNOSIS — Z01812 Encounter for preprocedural laboratory examination: Secondary | ICD-10-CM | POA: Diagnosis not present

## 2020-11-14 DIAGNOSIS — Z86711 Personal history of pulmonary embolism: Secondary | ICD-10-CM | POA: Diagnosis not present

## 2020-11-14 DIAGNOSIS — M9261 Juvenile osteochondrosis of tarsus, right ankle: Secondary | ICD-10-CM | POA: Diagnosis not present

## 2020-11-14 DIAGNOSIS — Z7901 Long term (current) use of anticoagulants: Secondary | ICD-10-CM | POA: Diagnosis not present

## 2020-11-14 DIAGNOSIS — Z79899 Other long term (current) drug therapy: Secondary | ICD-10-CM | POA: Diagnosis not present

## 2020-11-14 DIAGNOSIS — I10 Essential (primary) hypertension: Secondary | ICD-10-CM | POA: Diagnosis not present

## 2020-11-14 DIAGNOSIS — K759 Inflammatory liver disease, unspecified: Secondary | ICD-10-CM | POA: Diagnosis not present

## 2020-11-14 DIAGNOSIS — K279 Peptic ulcer, site unspecified, unspecified as acute or chronic, without hemorrhage or perforation: Secondary | ICD-10-CM | POA: Diagnosis not present

## 2020-11-14 DIAGNOSIS — I2510 Atherosclerotic heart disease of native coronary artery without angina pectoris: Secondary | ICD-10-CM | POA: Diagnosis not present

## 2020-11-14 DIAGNOSIS — M94261 Chondromalacia, right knee: Secondary | ICD-10-CM | POA: Diagnosis not present

## 2020-11-14 DIAGNOSIS — M25561 Pain in right knee: Secondary | ICD-10-CM | POA: Diagnosis not present

## 2020-11-14 DIAGNOSIS — K219 Gastro-esophageal reflux disease without esophagitis: Secondary | ICD-10-CM | POA: Diagnosis not present

## 2020-11-14 DIAGNOSIS — M2241 Chondromalacia patellae, right knee: Secondary | ICD-10-CM | POA: Diagnosis not present

## 2020-11-14 DIAGNOSIS — M1990 Unspecified osteoarthritis, unspecified site: Secondary | ICD-10-CM | POA: Diagnosis not present

## 2020-11-14 DIAGNOSIS — D759 Disease of blood and blood-forming organs, unspecified: Secondary | ICD-10-CM | POA: Diagnosis not present

## 2020-11-26 DIAGNOSIS — Z86711 Personal history of pulmonary embolism: Secondary | ICD-10-CM | POA: Diagnosis not present

## 2020-11-26 DIAGNOSIS — R9439 Abnormal result of other cardiovascular function study: Secondary | ICD-10-CM | POA: Diagnosis not present

## 2020-12-26 DIAGNOSIS — R059 Cough, unspecified: Secondary | ICD-10-CM | POA: Diagnosis not present

## 2021-02-12 DIAGNOSIS — I2699 Other pulmonary embolism without acute cor pulmonale: Secondary | ICD-10-CM | POA: Diagnosis not present

## 2021-02-12 DIAGNOSIS — N529 Male erectile dysfunction, unspecified: Secondary | ICD-10-CM | POA: Diagnosis not present

## 2021-02-12 DIAGNOSIS — Z86711 Personal history of pulmonary embolism: Secondary | ICD-10-CM | POA: Diagnosis not present

## 2021-02-12 DIAGNOSIS — I10 Essential (primary) hypertension: Secondary | ICD-10-CM | POA: Diagnosis not present

## 2021-02-12 DIAGNOSIS — R9439 Abnormal result of other cardiovascular function study: Secondary | ICD-10-CM | POA: Diagnosis not present

## 2021-02-12 DIAGNOSIS — R059 Cough, unspecified: Secondary | ICD-10-CM | POA: Diagnosis not present

## 2021-02-20 DIAGNOSIS — R053 Chronic cough: Secondary | ICD-10-CM | POA: Diagnosis not present

## 2021-02-20 DIAGNOSIS — J811 Chronic pulmonary edema: Secondary | ICD-10-CM | POA: Diagnosis not present

## 2021-02-20 DIAGNOSIS — R059 Cough, unspecified: Secondary | ICD-10-CM | POA: Diagnosis not present

## 2021-02-20 DIAGNOSIS — Z86711 Personal history of pulmonary embolism: Secondary | ICD-10-CM | POA: Diagnosis not present

## 2021-02-25 DIAGNOSIS — I10 Essential (primary) hypertension: Secondary | ICD-10-CM | POA: Diagnosis not present

## 2021-02-25 DIAGNOSIS — R053 Chronic cough: Secondary | ICD-10-CM | POA: Diagnosis not present

## 2021-02-25 DIAGNOSIS — Z86711 Personal history of pulmonary embolism: Secondary | ICD-10-CM | POA: Diagnosis not present

## 2021-05-05 DIAGNOSIS — M25561 Pain in right knee: Secondary | ICD-10-CM | POA: Diagnosis not present

## 2021-05-30 ENCOUNTER — Emergency Department (HOSPITAL_COMMUNITY)
Admission: EM | Admit: 2021-05-30 | Discharge: 2021-06-17 | Disposition: E | Payer: Medicare HMO | Attending: Emergency Medicine | Admitting: Emergency Medicine

## 2021-05-30 ENCOUNTER — Encounter (HOSPITAL_COMMUNITY): Payer: Self-pay

## 2021-05-30 DIAGNOSIS — I213 ST elevation (STEMI) myocardial infarction of unspecified site: Secondary | ICD-10-CM | POA: Diagnosis not present

## 2021-05-30 DIAGNOSIS — I4510 Unspecified right bundle-branch block: Secondary | ICD-10-CM | POA: Diagnosis not present

## 2021-05-30 DIAGNOSIS — I469 Cardiac arrest, cause unspecified: Secondary | ICD-10-CM | POA: Insufficient documentation

## 2021-05-30 DIAGNOSIS — R0681 Apnea, not elsewhere classified: Secondary | ICD-10-CM | POA: Diagnosis not present

## 2021-05-30 DIAGNOSIS — R9431 Abnormal electrocardiogram [ECG] [EKG]: Secondary | ICD-10-CM | POA: Diagnosis not present

## 2021-05-30 DIAGNOSIS — R079 Chest pain, unspecified: Secondary | ICD-10-CM | POA: Diagnosis not present

## 2021-05-30 DIAGNOSIS — R0789 Other chest pain: Secondary | ICD-10-CM | POA: Diagnosis not present

## 2021-05-30 MED ORDER — ATROPINE SULFATE 1 MG/ML IJ SOLN
INTRAMUSCULAR | Status: AC | PRN
  Administered 2021-05-30: 1 mg via INTRAVENOUS

## 2021-05-30 MED ORDER — EPINEPHRINE 1 MG/10ML IJ SOSY
PREFILLED_SYRINGE | INTRAMUSCULAR | Status: AC | PRN
Start: 2021-05-30 — End: 2021-05-30
  Administered 2021-05-30 (×4): 1 mg via INTRAVENOUS

## 2021-05-30 MED ORDER — CALCIUM CHLORIDE 10 % IV SOLN
INTRAVENOUS | Status: AC | PRN
  Administered 2021-05-30: 1 g via INTRAVENOUS

## 2021-05-30 MED ORDER — SODIUM BICARBONATE 8.4 % IV SOLN
INTRAVENOUS | Status: AC | PRN
  Administered 2021-05-30 (×2): 50 meq via INTRAVENOUS

## 2021-05-30 MED ORDER — EPINEPHRINE HCL 5 MG/250ML IV SOLN IN NS
0.5000 ug/min | INTRAVENOUS | Status: DC
  Administered 2021-05-30: 10.3333 ug/min via INTRAVENOUS
  Filled 2021-05-30: qty 250

## 2021-05-30 MED ORDER — SODIUM BICARBONATE 8.4 % IV SOLN
INTRAVENOUS | Status: AC | PRN
Start: 2021-05-30 — End: 2021-05-30
  Administered 2021-05-30: 50 meq via INTRAVENOUS

## 2021-05-30 MED ORDER — EPINEPHRINE 1 MG/10ML IJ SOSY
PREFILLED_SYRINGE | INTRAMUSCULAR | Status: AC | PRN
  Administered 2021-05-30 (×2): 1 mg via INTRAVENOUS

## 2021-06-17 NOTE — ED Notes (Signed)
PER EMS: pt from home, woke up this morning around 0245 complaining of chest pain. Pts wife called EMS, pt was found conscious, + pulses, alert but laying on the floor (did not fall). EMS connected the patient to their monitor where they saw his HR drop from the 40s to asystole, pt unresponsive. CPR initiated by EMS on scene.  BP initially was 150/100, CBG 256.   EMS began pacing patient minutes prior to patient arriving to the ED. Pt arrived to ED intubated and with epi drip infusing.   CPR x 3, vtach x 3 with Defib at 200 x 1, 10 mg of versed given for intubation, 4 epi, 300 amiodarone.  Pt in PEA on arrival to ED, chest compressions initiated. See code narrator.

## 2021-06-17 NOTE — Code Documentation (Signed)
Family updated as to patient's status and at bedside  

## 2021-06-17 NOTE — ED Provider Notes (Signed)
[facility] EMERGENCY DEPARTMENT Provider Note   CSN: [id number] Arrival date & time: 06-23-21  0430     History Chief Complaint  Patient presents with   POST CPR    Juan Wong is a 75 y.o. male.  The history is provided by the EMS personnel. The history is limited by the condition of the patient (Unresponsive, intubated).  He was brought in by ambulance as cardiac arrest.  Apparently, fire rescue was called for chest pain.  Patient was awake when EMS arrived, but quickly lost pulses.  CPR was initiated and patient was intubated and IV started.  He initially was in asystole and received epinephrine and CPR and did have return of spontaneous circulation.  He rhythm and then went to pulseless electrical activity and he was given additional epinephrine.  He did have episode of ventricular tachycardia and received amiodarone.  He is now also had bradycardia and was started on an external pacemaker.  He received a total of 4 mg of epinephrine in route.  There were 3 occasions were pulses were obtained only to be lost.  He arrived with external pacer functioning.   No past medical history on file.  There are no problems to display for this patient.   ** The histories are not reviewed yet. Please review them in the "History" navigator section and refresh this [place].     No family history on file.     Home Medications Prior to Admission medications   Not on File    Allergies    Patient has no allergy information on record.  Review of Systems   Review of Systems  Unable to perform ROS: Intubated   Physical Exam Updated Vital Signs BP (!) 0/0   Pulse (!) 0   Resp (!) 0   SpO2 (!) 0%   Physical Exam Vitals and nursing note reviewed.  76 year old male, intubated and being ventilated by Ambu bag. Head is normocephalic and atraumatic.  Pupils 5 mm and nonreactive.  Endotracheal tube in place. Lungs have symmetric airflow. Chest shows no  deformity. Heart tones are absent. Abdomen is soft, flat. Extremities have no cyanosis or edema. Skin is warm and dry without rash. Neurologic: GCS=3.  ED Results / Procedures / Treatments    EKG EKG Interpretation  Date/Time:  06/23/21 05:09:44 EDT Ventricular Rate:  108 PR Interval:    QRS Duration: 194 QT Interval:  420 QTC Calculation: 562 R Axis:   -83 Text Interpretation: Wide QRS rhythm with Premature ventricular complexes or Fusion complexes Left axis deviation Right bundle branch block Septal infarct , age undetermined Abnormal ECG No old tracing to compare Confirmed by Delora Fuel ([id number]) on 06/23/2021 5:14:46 AM  Procedures Procedures  Cardiopulmonary Resuscitation (CPR) Procedure Note Directed/Performed by: Delora Fuel I personally directed ancillary staff and/or performed CPR in an effort to regain return of spontaneous circulation and to maintain cardiac, neuro and systemic perfusion.   Medications Ordered in ED Medications  EPINEPHrine (ADRENALIN) 5 mg in NS 250 mL (0.02 mg/mL) premix infusion (0 mcg/min Intravenous Stopped 06/23/2021 0518)  EPINEPHrine (ADRENALIN) 1 MG/10ML injection (1 mg Intravenous Given 06/23/2021 0448)  sodium bicarbonate injection (50 mEq Intravenous Given 06-23-2021 0450)  atropine injection (1 mg Intravenous Given 06-23-21 0443)  calcium chloride injection (1 g Intravenous Given Jun 23, 2021 0442)  EPINEPHrine (ADRENALIN) 1 MG/10ML injection (1 mg Intravenous Given 06/23/2021 0500)  sodium bicarbonate injection (50 mEq Intravenous Given 06-23-21 0457)  ED Course  I have reviewed the triage vital signs and the nursing notes.  Patient arrived after out of hospital CPR.  Out of hospital care had been going on for approximately 1-1/2 hours.  He arrived with external pacer functioning but no pulse identified.  Pacing was stopped and CPR was initiated.  He was given epinephrine with no response.  Monitor showed agonal rhythm.  He was  given additional epinephrine and sodium bicarbonate and heart rate improved.  Pulses were not definitely felt and bedside ultrasound did show some cardiac activity.  Pulses did improve, but pulses were then lost.  He was given additional epinephrine as well as atropine, bicarbonate and had return of pulses once again.  He was being maintained on epinephrine drip throughout this time.  Pulses were lost again and CPR was reinitiated and was given additional epinephrine and bicarbonate.  There was return of pulses, but they were weak.  His wife had arrived, and I discussed the events that it occurred including the fact that he did not appear to be likely to survive.  He EKG showed right bundle branch block and wide complexes, no definite myocardial infarction but diffuse ST segment changes.  Decision was made to maintain him on an epinephrine drip but no other aggressive treatment.  His heart rhythm gradually slowed to agonal rhythm and then asystole.  He was pronounced dead at 5:18AM with wife at the bedside.  Wife does tell me that he had been complaining of some chest pain a few days ago but refused to see the physician at that time.  Old records were reviewed, and he had no relevant past visits.  MDM Rules/Calculators/A&P                          Final Clinical Impression(s) / ED Diagnoses Final diagnoses:  Cardiac arrest [facility])    Rx / DC Orders ED Discharge Orders     None        Delora Fuel, MD 40/98/11 [phone]

## 2021-06-17 NOTE — Code Documentation (Signed)
Ventilator turned off by Dr. Roxanne Mins

## 2021-06-17 NOTE — Code Documentation (Signed)
Cardiac [place] again

## 2021-06-17 NOTE — Code Documentation (Signed)
Loss of pulse. Dr Roxanne Mins at bedside with family

## 2021-06-17 NOTE — Code Documentation (Signed)
Patient time of death occurred at 71.

## 2021-06-17 NOTE — Code Documentation (Signed)
EKG done

## 2021-06-17 NOTE — ED Notes (Signed)
Pts wife has left ER. She took two gold colored rings with her. No other patient belongings.

## 2021-06-17 NOTE — Code Documentation (Signed)
Cardiac ultrasound

## 2021-06-17 DEATH — deceased
